# Patient Record
Sex: Male | Born: 1965 | Race: Black or African American | Hispanic: No | Marital: Single | Smoking: Never smoker
Health system: Southern US, Community
[De-identification: ages and names within clinical notes are randomized; demographics above are authoritative.]

## PROBLEM LIST (undated history)

## (undated) DIAGNOSIS — I10 Essential (primary) hypertension: Secondary | ICD-10-CM

## (undated) DIAGNOSIS — E119 Type 2 diabetes mellitus without complications: Secondary | ICD-10-CM

## (undated) HISTORY — PX: HERNIA REPAIR: SHX51

## (undated) HISTORY — PX: KNEE ARTHROSCOPY: SUR90

## (undated) HISTORY — PX: EYE SURGERY: SHX253

---

## 1997-07-31 ENCOUNTER — Ambulatory Visit (HOSPITAL_BASED_OUTPATIENT_CLINIC_OR_DEPARTMENT_OTHER): Admission: RE | Admit: 1997-07-31 | Discharge: 1997-07-31 | Payer: Self-pay | Admitting: *Deleted

## 2002-06-21 ENCOUNTER — Emergency Department (HOSPITAL_COMMUNITY): Admission: EM | Admit: 2002-06-21 | Discharge: 2002-06-21 | Payer: Self-pay | Admitting: Emergency Medicine

## 2003-07-07 ENCOUNTER — Emergency Department (HOSPITAL_COMMUNITY): Admission: EM | Admit: 2003-07-07 | Discharge: 2003-07-07 | Payer: Self-pay

## 2005-09-08 ENCOUNTER — Inpatient Hospital Stay (HOSPITAL_COMMUNITY): Admission: EM | Admit: 2005-09-08 | Discharge: 2005-09-10 | Payer: Self-pay | Admitting: Emergency Medicine

## 2005-09-08 ENCOUNTER — Ambulatory Visit: Payer: Self-pay | Admitting: Internal Medicine

## 2005-09-08 ENCOUNTER — Ambulatory Visit: Payer: Self-pay | Admitting: Cardiology

## 2005-09-12 ENCOUNTER — Ambulatory Visit: Payer: Self-pay | Admitting: Internal Medicine

## 2005-09-24 ENCOUNTER — Ambulatory Visit: Payer: Self-pay | Admitting: Internal Medicine

## 2005-10-13 ENCOUNTER — Ambulatory Visit: Payer: Self-pay | Admitting: Cardiology

## 2005-12-16 ENCOUNTER — Ambulatory Visit: Payer: Self-pay | Admitting: Internal Medicine

## 2006-04-09 DIAGNOSIS — I10 Essential (primary) hypertension: Secondary | ICD-10-CM | POA: Insufficient documentation

## 2006-11-24 ENCOUNTER — Telehealth: Payer: Self-pay | Admitting: *Deleted

## 2006-12-03 ENCOUNTER — Emergency Department (HOSPITAL_COMMUNITY): Admission: EM | Admit: 2006-12-03 | Discharge: 2006-12-03 | Payer: Self-pay | Admitting: Emergency Medicine

## 2007-01-12 ENCOUNTER — Encounter (INDEPENDENT_AMBULATORY_CARE_PROVIDER_SITE_OTHER): Payer: Self-pay | Admitting: *Deleted

## 2007-02-03 ENCOUNTER — Ambulatory Visit: Payer: Self-pay | Admitting: Internal Medicine

## 2007-02-03 DIAGNOSIS — Z947 Corneal transplant status: Secondary | ICD-10-CM | POA: Insufficient documentation

## 2007-02-03 DIAGNOSIS — J45909 Unspecified asthma, uncomplicated: Secondary | ICD-10-CM | POA: Insufficient documentation

## 2007-02-03 DIAGNOSIS — Z87442 Personal history of urinary calculi: Secondary | ICD-10-CM

## 2007-04-05 ENCOUNTER — Ambulatory Visit (HOSPITAL_BASED_OUTPATIENT_CLINIC_OR_DEPARTMENT_OTHER): Admission: RE | Admit: 2007-04-05 | Discharge: 2007-04-05 | Payer: Self-pay | Admitting: Orthopedic Surgery

## 2007-04-07 ENCOUNTER — Encounter (INDEPENDENT_AMBULATORY_CARE_PROVIDER_SITE_OTHER): Payer: Self-pay | Admitting: *Deleted

## 2007-07-03 IMAGING — CR DG CHEST 1V PORT
2 series · 2 of 2 positions shown · non-contrast
Comparison: 07/07/03.

CLINICAL DATA: Chest pain. 
 PORTABLE CHEST -  SINGLE VIEW - 09/08/05:

[view not recorded (1 of 2)]
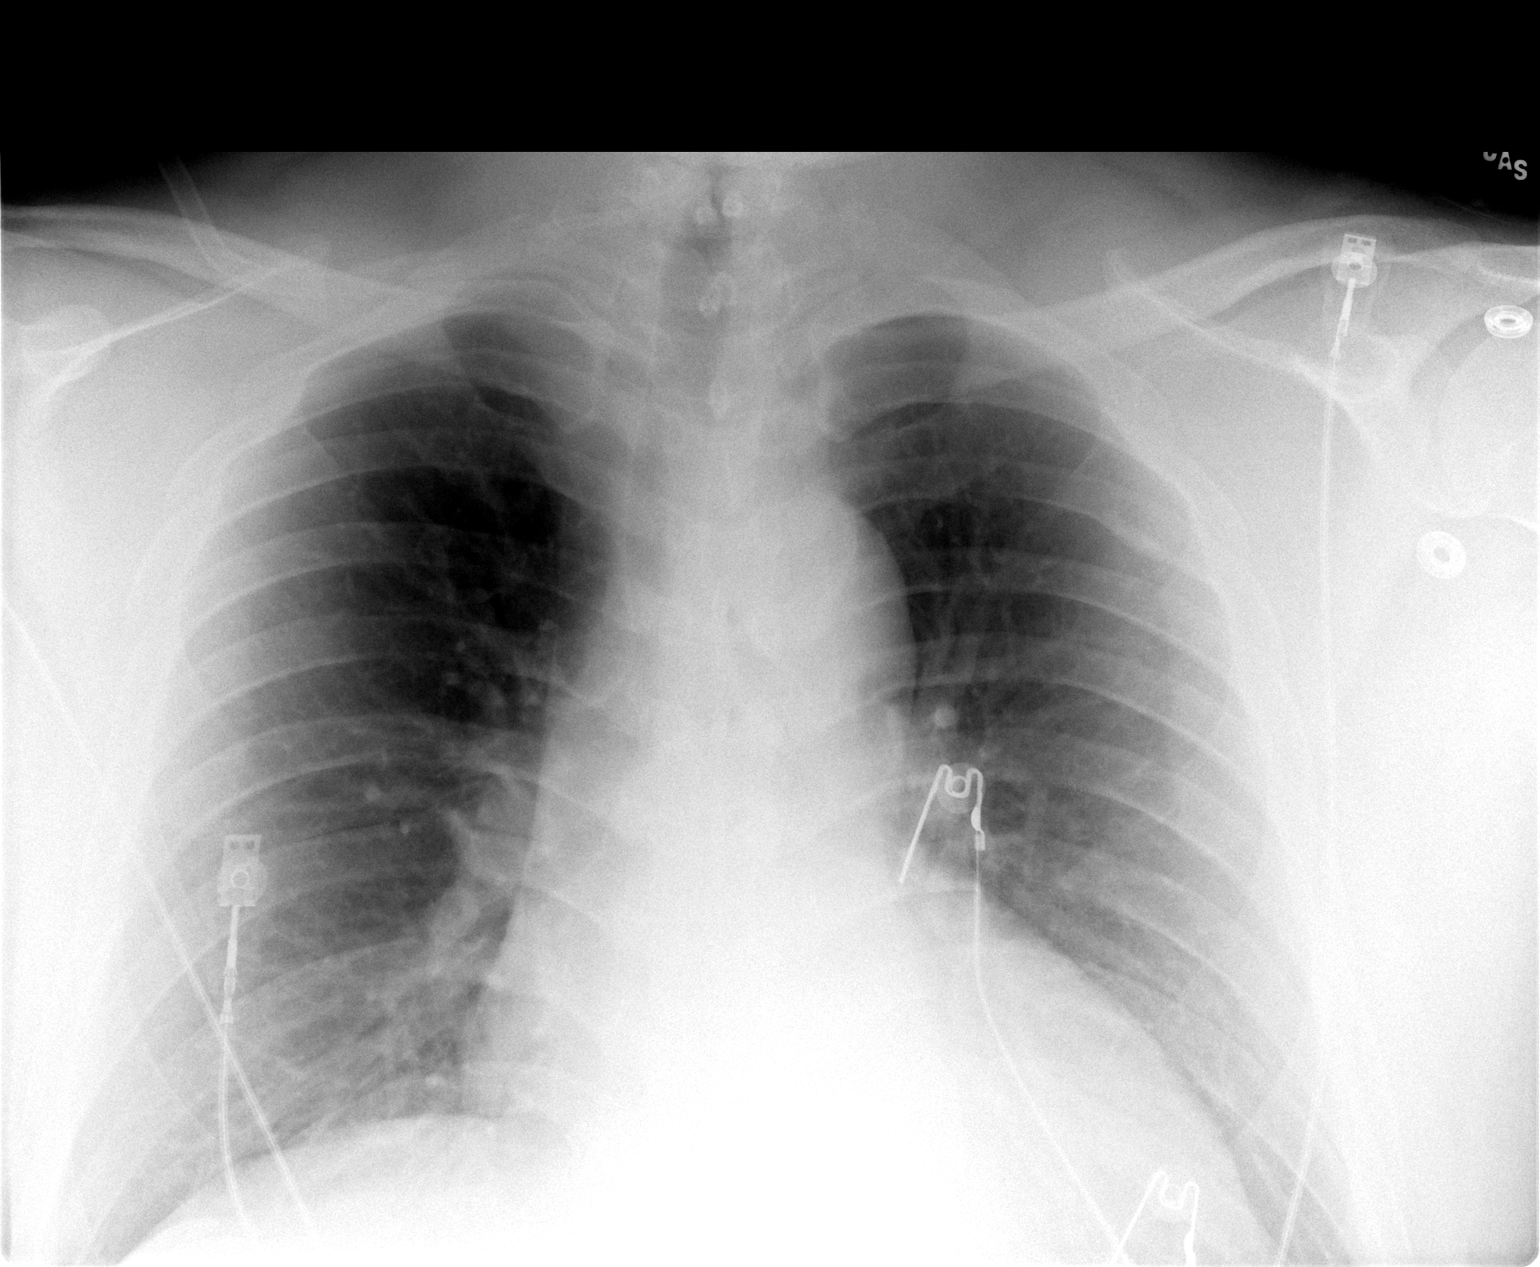

[view not recorded (2 of 2)]
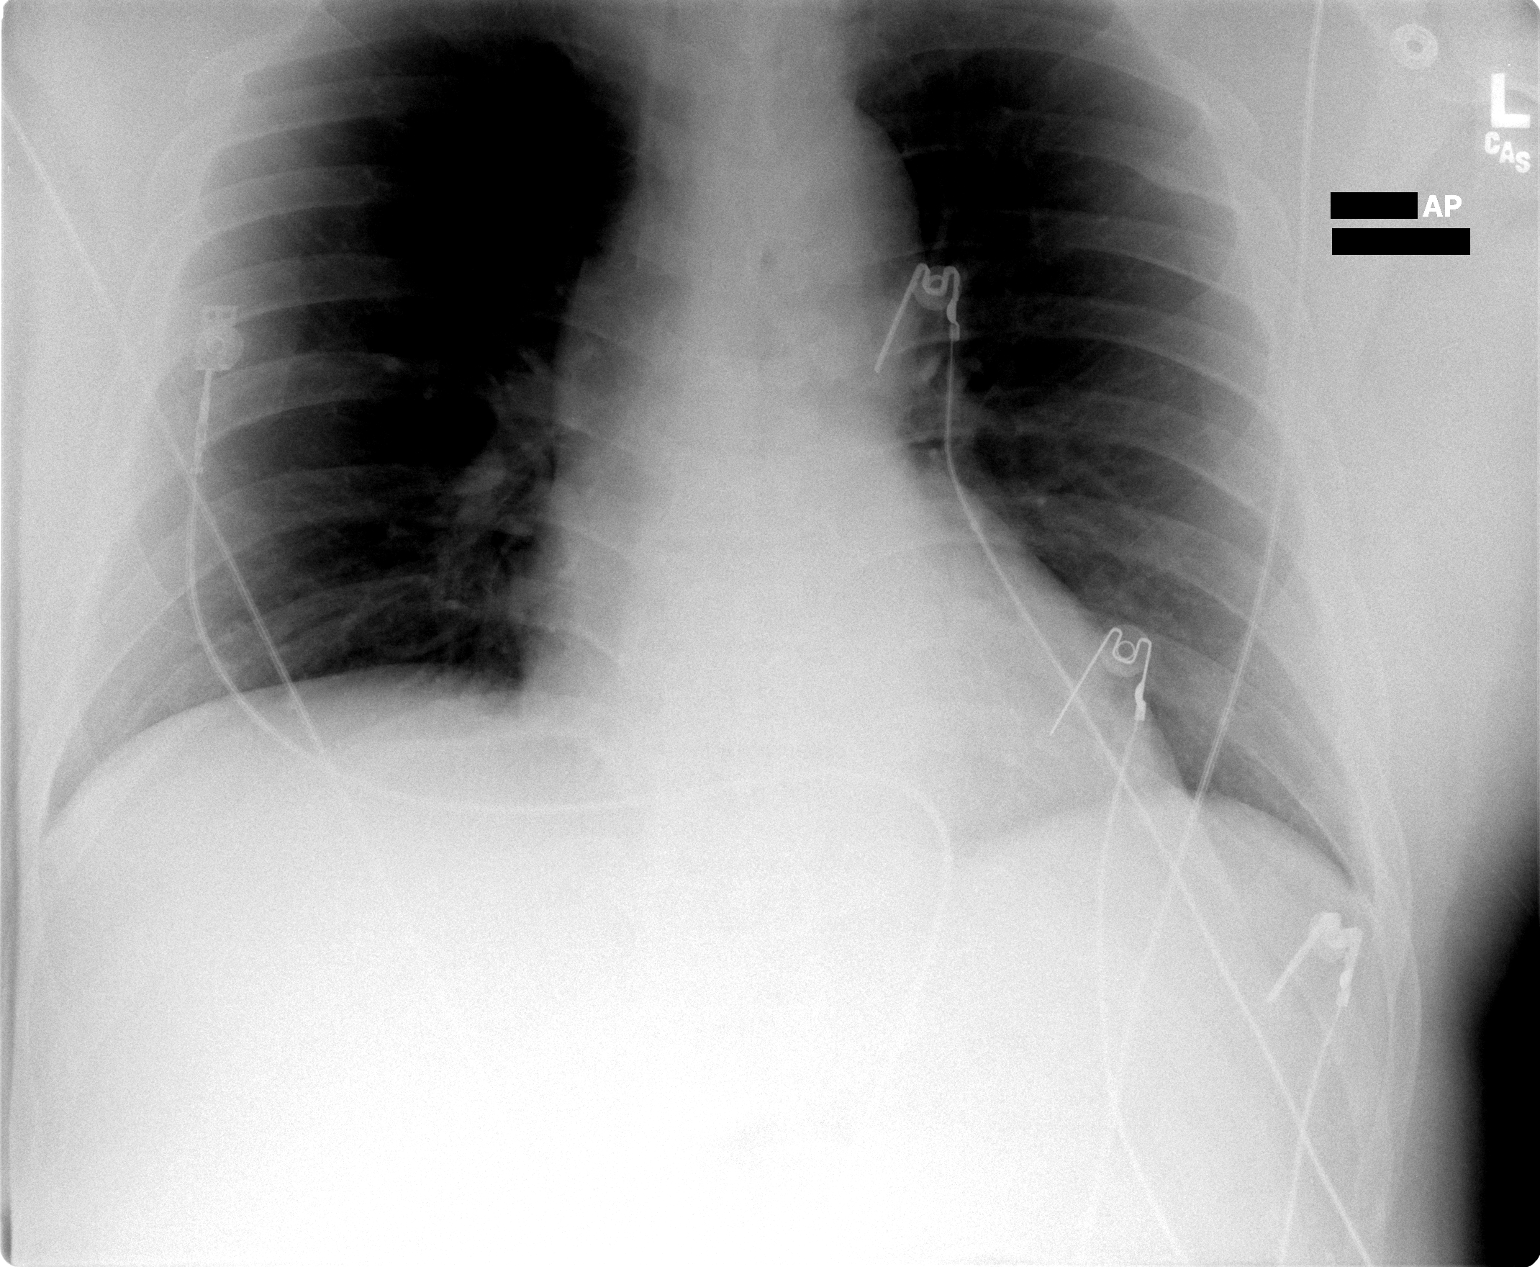

[2 of 2 positions shown; findings below may reference images not displayed]

FINDINGS: The heart size is within normal limits.  The lungs are clear.
IMPRESSION: As discussed above.

## 2010-09-03 NOTE — Op Note (Signed)
Jared Miller, Jared Miller                ACCOUNT NO.:  [id number]   MEDICAL RECORD NO.:  [id number]          PATIENT TYPE:  AMB   LOCATION:  DSC                          FACILITY:  [facility]   PHYSICIAN:  Harvie Junior, M.D.   DATE OF BIRTH:  February 22, 1966   DATE OF PROCEDURE:  04/05/2007  DATE OF DISCHARGE:  04/05/2007                               OPERATIVE REPORT   PREOPERATIVE DIAGNOSIS:  Medial meniscal tear with questionable anterior  cruciate ligament tear.   POSTOPERATIVE DIAGNOSES:  1. Posterior horn medial meniscal tear.  2. Chondromalacia, medial patellar facet.  3. Large medial shelf plica.  4. Intact anterior cruciate ligament.   PRINCIPLE PROCEDURE:  1. Partial posterior horn medial meniscectomy.  2. Debridement of a medial patellar facet.  3. Debridement of medial shelf plica.   SURGEON:  Harvie Junior, M.D.   ASSISTANT:  Marshia Ly, P.A.   ANESTHESIA:  General.   BRIEF HISTORY:  [name] is a 45 year old male with a long history of  having had a twisting-type injury at work.  He was evaluated in the  office and felt to have a medial meniscal tear.  MRI had been obtained  prior to our seeing him, which showed that he had a posterior horn  medial meniscal tear.  There was a question of an anterior cruciate  ligament tear, remote, although his examination felt to be stable.  Ultimately, because of this large meniscal tear, he was taken to the  operating room for operative knee arthroscopy.   PROCEDURE:  Patient was taken to the operating room.  After adequate  anesthesia was obtained under general anesthetic, the patient was placed  on the operating room table.  The right leg was prepped and draped in  the usual sterile fashion.  Following this, routine arthroscopic  examination of the knee revealed there was an obvious posterior horn  medial meniscal tear.  This was debrided back to a smooth and stable rim  with a fairly large tear and somewhat difficult to get to  in the back,  but we were able to debride it.  I probably took out between 15-20% of  the meniscal and total in the posterior area.  With complete attention  turned to the notch where ACL was evaluated and probed and felt to be  stable.  We had good resistance to anterior translation, really appeared  to be normal.  The lateral side was normal.  In the medial gutter, there  was a significant medial shelf plica which was debrided back to the rim,  and he also had some grade II and grade III change of the medial  patellar facet, which was debrided.  A little bit of lateral tracking,  but certainly there was plenty of rim up under the patella.  I did not  feel a lateral retinacular release was appropriate.  At this point, the  knee was copiously and thoroughly irrigated and suctioned dry.  The  arthroscopic  portals were closed.  The bandage, sterile compressive dressing was  applied.  Patient was taken to the recovery room.  Was noted to be in  satisfactory condition.   Estimated blood loss for the procedure was __________ .      Harvie Junior, M.D.  Electronically Signed     JLG/MEDQ  D:  04/05/2007  T:  04/05/2007  Job:  [id number]

## 2010-09-06 NOTE — Consult Note (Signed)
Jared Miller, NEUSER                ACCOUNT NO.:  [id number]   MEDICAL RECORD NO.:  [id number]          PATIENT TYPE:  INP   LOCATION:  1830                         FACILITY:  [facility]   PHYSICIAN:  Thomas C. Wall, M.D.   DATE OF BIRTH:  1966-03-18   DATE OF CONSULTATION:  09/08/2005  DATE OF DISCHARGE:                                   CONSULTATION   CARDIOLOGY CONSULTATION   PRIMARY CARE PHYSICIANS:  Primary care physician:  Patient does not have  one.  Admitting physician:  Dr. Ileana Roup.  Cardiologist:  Patient is  new to [facility] and is being seen by Dr. Jesse Sans. Wall.   PATIENT PROFILE:  The patient is a 45 year old African-American male with no  prior history of coronary artery disease who presents with hypertensive  urgency and chest tightness.   PROBLEM LIST:  1.  Hypertensive urgency.  2.  Status post left corneal transplant.  3.  Status post umbilical hernia repair in 1998.  4.  Nephrolithiasis with stent in 1996.  5.  Asthma.  6.  Obesity.   HISTORY OF PRESENT ILLNESS:  45 year old African-American male with no prior  history of coronary artery disease.  He has not been seen by a physician in  approximately two years and has no primary care physician.  He was in his  usual state of health until this morning when he was driving to work and  developed mild nausea.  Once at work he was sitting and developed 8 out of  10 substernal chest tightness and squeezing moving into his throat  associated with significant shortness of breath.  He saw the R.N. on site at  his work place and EMS was called.  On their arrival his blood pressure was  224/148.  He was treated with three sublingual nitroglycerin and 160 mg of  aspirin with reduction of chest tightness to 2 out of 10 and complete  improvement in his shortness of breath.  He was taken to the [facility]  emergency department where electrocardiogram was within normal limits and  cardiac markers are  negative x1.   ALLERGIES:  PENICILLIN.   HOME MEDICATIONS:  Pred Forte one drop right eye daily.   FAMILY HISTORY:  Mother is age 68 with hypertension.  Father died at 40 of  colon cancer.  He also had diabetes and suffered an myocardial infarction in  his life.  Siblings: He has one brother age 53 and has hypertension.   SOCIAL HISTORY:  He lives in [place] with his fiancee'.  He works for  [employer] on [employer].  He has two boys. He  has never smoked tobacco.  He drinks maybe two beers per month.  He has  never used any drugs.  He does not routinely exercise.   REVIEW OF SYSTEMS:  Positive for headache after nitroglycerin today.  Positive for chest pain and shortness of breath as described in the history  of present illness.  Positive for mild nausea.  All other systems reviewed  and negative.   PHYSICAL EXAMINATION:  VITAL SIGNS:  Temperature 98.6, heart rate 72,  respirations 18, blood pressure 170/102. Pulse oximetry 98% on 2L.  GENERAL APPEARANCE:  Pleasant African-American male in no acute distress. He  is awake, alert and oriented x3.  NECK:  Obese, difficult to assess JVP.  No bruits.  LUNGS:  Respirations regular and unlabored.  Clear to auscultation .  CARDIAC:  Regular S1, S2, no S3, S4 or murmurs.  ABDOMEN:  Round, soft, nontender, nondistended.  Bowel sounds present x4.  EXTREMITIES:  Warm and dry.  No cyanosis, clubbing or edema.  Dorsalis pedis  pulses and posterior tibial pulses are 2+ and equal bilaterally.   CLINICAL DATA:  Electrocardiogram shows sinus rhythm, normal axis, rate 79.  No ST-T changes.   LABORATORY DATA:  Sodium 137, potassium 3.9, chloride 105, cO2 23, BUN 11,  creatinine 1.1, glucose 96, total bilirubin 0.7, alkaline phosphatase 67,  AST 23, ALT 37, total protein 7.2, albumin 4.2, calcium 9.6.   ASSESSMENT/PLAN:  1.  Hypertensive urgency.  Patient presented with chest tightness, shortness      of breath in  the setting of markedly elevated blood pressure.  His blood      pressure is down some, into the 170's, with more-or-less resolution of      the chest pressure and no shortness of breath at this time.  His cardiac      markers are negative x1 with an electrocardiogram that shows no acute      changes.  We will continue to cycle his cardiac markers.  Will also      check a 2-dimensional echocardiogram as an inpatient.  If both enzymes      and echocardiogram are within normal limits could plan to discharge with      followup outpatient functional study, once blood pressure is under      control.  Will add hydrochlorothiazide and amlodipine for starters.  We      have stressed the importance of a low sodium diet as well as weight      loss.  We will check lipids.  2.  Chest pain.  See above.  Cycles enzymes.  3.  Asthma.  No inhalers are home. He is not currently wheezing.  4.  Obesity.  Recommended weight loss.      Ok Anis, NP      Jesse Sans. Wall, M.D.  Electronically Signed    CRB/MEDQ  D:  09/08/2005  T:  09/09/2005  Job:  [id number]

## 2010-09-06 NOTE — Assessment & Plan Note (Signed)
[facility]                          [place] OFFICE NOTE   NAME:Jared Miller, Jared Miller                       MRN:          [id number]  DATE:12/16/2005                            DOB:          1965-06-21    CHIEF COMPLAINT:  Here to establish.   HISTORY OF PRESENT ILLNESS:  Mr. Jared Miller is a 45 year old gentleman who came to  the office to get established.  Three months ago he felt poorly.  He had  headache, chest pain, and shortness of breath.  He was rushed to the  hospital, where his blood pressure was found to be extremely high for the  first time ever.  He was stabilized.  At the present time, he is doing  great.  During that admission, an echo was done and it was negative.  Again  at the present time, he is basically asymptomatic.  He is ambulatory.  Readings are around 120/80.  He needs refills today.   PAST MEDICAL HISTORY:  1. Hypertension, diagnosed 3 months ago.  2. History of asthma.  3. Umbilical hernia repair in 1999.  4. History of a kidney stone with a stent in 1997.  5. 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  6. Strong family history of heart disease, also prostate and colon cancer.   FAMILY HISTORY:  Father according with the patient suffered from prostate  cancer, colon cancer, diabetes, and have an early in life MI.  Mother has  hypertension.   SOCIAL HISTORY:  Does not smoke.  Drinks rarely.  He is single and has 2  kids.   REVIEW OF SYSTEMS:  Again, he is doing well.  Denies any current headache,  chest pain, shortness of breath.   MEDICATIONS:  1. HCTZ 25, one p.o. every day.  2. Amlodipine 10, one p.o. every day.  3. Toprol XL 100, one p.o. every day.  4. Lisinopril 40, one p.o. every day.   ALLERGIES:  PENICILLIN CAUSED HIVES AND AN ASTHMA ATTACK.   PHYSICAL EXAMINATION:  GENERAL:  The patient is alert, oriented, in no  apparent distress.  VITAL SIGNS:  He is 6  feet 3 inches tall.  He weighs 201 pounds.  Blood  pressure today is 150/100 in the office.  Pulse 80.  NECK:  No thyromegaly.  LUNGS:  Clear to auscultation bilaterally.  CARDIOVASCULAR:  Regular rate and rhythm without a murmur.  ABDOMEN:  Not distended.  Soft.  Good bowel sounds.  No organomegaly.  I  hear no bruits.  EXTREMITIES:  No edema.   LABORATORY AND X-RAYS:  Records from the hospital are reviewed.  On May  2007, his baseline creatinine without any medication was 1.1 and a potassium  of 3.9.  LFTs were normal.  TSH was normal.  Total cholesterol was 166, LDL  87, HDL 31.  A1c was 5.7.  He did have a positive microalbuminuria test.   ASSESSMENT/PLAN:  1. The patient is here for refill of his medication.  I did a 30-day  supply with 12 refills on all his medications.  2. He has good ambulatory blood pressure readings.  3. We will recheck a BMP and an albumin.  4. His A1c is slightly increased and will have to be rechecked at some      point this year.  5. He is encouraged to have a healthy lifestyle.  Information about low      salt diet and exercise was given to the patient.  6. I also talked to him.  I gave him information about hypertension.  I      discussed with him the long term risk of high blood pressure and the      fact that he will need medical checkups and taking medication for life      to get the blood pressure well controlled.  7. Office visit in 3 months.                                   Willow Ora, MD   JP/MedQ  DD:  12/16/2005  DT:  12/16/2005  Job #:  [id number]

## 2010-09-06 NOTE — Discharge Summary (Signed)
Jared Miller, UPTAIN                ACCOUNT NO.:  [id number]   MEDICAL RECORD NO.:  [id number]          PATIENT TYPE:  INP   LOCATION:  4710                         FACILITY:  [facility]   PHYSICIAN:  Ileana Roup, M.D.  DATE OF BIRTH:  1965/06/28   DATE OF ADMISSION:  09/08/2005  DATE OF DISCHARGE:  09/10/2005                                 DISCHARGE SUMMARY   DISCHARGE DIAGNOSES:  1.  Hypertensive urgency, new diagnosis of hypertension.  2.  Acute coronary syndrome.  3.  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  4.  History of asthma.  5.  History of umbilical hernia repair in 1998.  6.  History of nephrolithiasis with stent placement in 1996.   DISCHARGE MEDICATIONS:  1.  Lisinopril 40 mg 1 tablet daily.  2.  Hydrochlorothiazide 25 mg 1 tablet daily.  3.  Amlodipine 10 mg 1 tablet daily.  4.  Toprol-XL 75 mg total, 3 of 25 mg tablets to be taken daily.  5.  Aspirin 81 mg daily.  6.  Pred Forte 2% solution 1 drop to the right eye daily.   HOSPITAL FOLLOWUP APPOINTMENTS:  1.  Outpatient clinic for blood pressure check and follow up basic metabolic      panel on Sep 12, 2005, at 2 p.m.  2.  Dr. Valera Castle, [facility] on June 25 at 1:45 p.m.  also, the      patient will contact [facility] regarding his      establishment of primary care with their institution.   DISPOSITION:  Stable.   CONSULTATIONS:  Cardiology.   PROCEDURE:  Two-D echocardiogram demonstrating normal ejection fraction  except for increased left ventricular wall thickness in a pattern of  moderate concentric hypertrophy.   HISTORY AND PHYSICAL:  Jared Miller is a 45 year old African-American man, with  no known history of hypertension, who presented to the emergency room with  complaints of significant shortness of breath and chest pain which occurred  at rest while at work.  Apparently, the patient was sitting at his desk when  suddenly he developed  severe substernal chest pain at 8 in the morning,  described as heightening on an 8/10 scale which was nonradiating.  He tried  to walk the chest pain off around the office to ease the discomfort, but had  no significant relief.  The patient also tried over-the-counter Rolaids, but  because of the persistent nature, saw the office nurse who gave him some  oxygen and then called EMS.  He received 3 sublingual nitroglycerins, as  well as 1 aspirin and experienced a slight relief of his pain.  He denied  any associated diaphoresis, palpitations, nausea, vomiting, swelling of the  limbs, cough, or fevers or chills.  The patient did complain of a history of  chest pain and asthmatic shortness of breath whenever he mows his lawn, but  he states this has been ongoing.  In the emergency room, the patient  received 4 mg of IV morphine.  He was placed on a nitroglycerin drip, as  well  as 5 mg of IV Lopressor and Tylenol dosing.   HOME MEDICATIONS:  Pred Forte 1 drop 2% to the right eye daily.   SOCIAL HISTORY:  The patient is currently married.  He works at the [employer].   SUBSTANCE HISTORY:  The patient denies any tobacco use, cocaine, or IV  drugs.   PHYSICAL EXAMINATION:  VITAL SIGNS:  Temperature 98.6, blood pressure on  admission 184/122, pulse 110, respirations 22, O2 saturation 95% on room  air.  GENERAL:  The patient, at the time, was receiving IV nitroglycerin and  appeared in no significant distress, although complaining of a mild  headache.  HEENT:  Demonstrated corneal scarring to the right eye with no evidence of  papilledema by funduscopic exam.  NECK:  Supple with no adenopathy or thyromegaly.  LUNGS:  Clear to auscultation bilaterally with no wheezes, rales, or  rhonchi.  CARDIOVASCULAR:  Showed a tachycardic rhythm, but no appreciable murmurs,  rubs, or gallops, and the chest pain was nonreproducible with percussion.  The rest of the exam was essentially  unremarkable.   ADMISSION LAB VALUES:  D-dimer was less than 0.22, point of care markers  were unremarkable for cardiac enzymes.  I-STAT;  Sodium 137, potassium 4,  chloride 107, bicarbonate 23, BUN 10, creatinine 1.2, glucose 109.  CBC:  Hemoglobin 14.5, hematocrit 43.3, white blood cell count 9.2, platelets 282.   HOSPITAL COURSE:  1.  Hypertensive urgency.  The patient, as stated above, was on a      nitroglycerin drip at the time of evaluation.  Subsequently, this was      titrated down as the patient's blood pressure improved.  He was      subsequently started on hydrochlorothiazide and amlodipine with 2-D      echocardiogram ordered for evaluation.  TSH was essentially      unremarkable.  Followup cardiac enzymes were also unremarkable.      Cardiology was consulted shortly after presentation because of his      significant chest pain and profound hypertensive disease.  They agreed      with the management and recommended a fasting lipid panel as well as the      follow up for 2-D echocardiogram.  After essentially ruling out for an      acute MI, a 2-D echocardiogram revealed moderate concentric hypertrophy,      although over the course of the next few days, the blood pressure raised      somewhat such that the patient required addition of metoprolol 25 mg      t.i.d., increasing his amlodipine to 10 mg daily, and adding lisinopril      20 mg.  By the day of discharge, cardiology recommended increasing his      lisinopril to 40 mg daily, hydrochlorothiazide to 25 mg daily, and a      decision was made to change the patient over to Toprol-XL 75 mg total.      The patient was advised that a followup appointment would be made to      monitor his basic metabolic panel for Friday, Sep 12, 2005.  Also      cardiology appointment was made for June 25 to see Dr. Daleen Squibb.  By the day     of discharge, the patient's blood pressure lying was 132/84 with pulse      of 87, and orthostatic vital  signs were obtained which demonstrated no  acute orthostatic hypertension by the day of discharge.   1.  Acute coronary syndrome.  The patient, as above, ruled out for acute      myocardial infarction with negative enzymes.  EKG changes were also      unremarkable during this admission, all demonstrating a normal sinus      rhythm.  Based on the echocardiogram with no wall motion abnormalities,      it was felt that the patient did not require any further invasive      testing.   1.  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      The patient presented on Pred Forte ophthalmologic drops and these were      continued during his hospitalization.  No problems were noted with this      diagnosis.   DISCHARGE LABORATORY STUDIES:  Hemoglobin 14.8, hematocrit 43.8, platelets  281.  Sodium 136, potassium 3.9, BUN 10, creatinine 1.2, glucose 107.  Hemoglobin A1c 5.7.  Micro albumin to creatinine ratio was 126.7 mg/g which  is significantly elevated.  Protein to creatinine urine ratio was slightly  elevated at 0.19.  TSH was 0.495.  Fasting lipid panel:  Cholesterol 166,  triglycerides 239, HDL 31, LDL 87.      Coralie Carpen, M.D.    ______________________________  Ileana Roup, M.D.    FR/MEDQ  D:  09/10/2005  T:  09/10/2005  Job:  [id number]   cc:   Thomas C. Wall, M.D.  [address]  [address]  [place]  [place] [address]

## 2011-01-24 LAB — I-STAT 8, (EC8 V) (CONVERTED LAB)
Bicarbonate: 24.3 — ABNORMAL HIGH
Chloride: 105
Potassium: 4.3
Sodium: 137
pH, Ven: 7.389 — ABNORMAL HIGH

## 2014-01-29 ENCOUNTER — Emergency Department (HOSPITAL_COMMUNITY)
Admission: EM | Admit: 2014-01-29 | Discharge: 2014-01-29 | Disposition: A | Discharge status: Home / Self Care | Payer: Self-pay | Attending: Emergency Medicine | Admitting: Emergency Medicine

## 2014-01-29 ENCOUNTER — Emergency Department (HOSPITAL_COMMUNITY): Payer: Self-pay

## 2014-01-29 ENCOUNTER — Encounter (HOSPITAL_COMMUNITY): Payer: Self-pay | Admitting: Emergency Medicine

## 2014-01-29 DIAGNOSIS — Z79899 Other long term (current) drug therapy: Secondary | ICD-10-CM | POA: Insufficient documentation

## 2014-01-29 DIAGNOSIS — Z791 Long term (current) use of non-steroidal anti-inflammatories (NSAID): Secondary | ICD-10-CM | POA: Insufficient documentation

## 2014-01-29 DIAGNOSIS — S161XXA Strain of muscle, fascia and tendon at neck level, initial encounter: Secondary | ICD-10-CM | POA: Insufficient documentation

## 2014-01-29 DIAGNOSIS — I10 Essential (primary) hypertension: Secondary | ICD-10-CM | POA: Insufficient documentation

## 2014-01-29 DIAGNOSIS — S39012A Strain of muscle, fascia and tendon of lower back, initial encounter: Secondary | ICD-10-CM | POA: Insufficient documentation

## 2014-01-29 DIAGNOSIS — Z880 Allergy status to penicillin: Secondary | ICD-10-CM | POA: Insufficient documentation

## 2014-01-29 DIAGNOSIS — Y92410 Unspecified street and highway as the place of occurrence of the external cause: Secondary | ICD-10-CM | POA: Insufficient documentation

## 2014-01-29 DIAGNOSIS — Y9389 Activity, other specified: Secondary | ICD-10-CM | POA: Insufficient documentation

## 2014-01-29 HISTORY — DX: Essential (primary) hypertension: I10

## 2014-01-29 MED ORDER — HYDROCODONE-ACETAMINOPHEN 5-325 MG PO TABS: 1.0000 | ORAL_TABLET | ORAL | Status: AC | PRN

## 2014-01-29 MED ORDER — DIAZEPAM 5 MG PO TABS: 5.0000 mg | ORAL_TABLET | Freq: Two times a day (BID) | ORAL | Status: AC

## 2014-01-29 MED ORDER — HYDROCHLOROTHIAZIDE 25 MG PO TABS
25.0000 mg | ORAL_TABLET | Freq: Once | ORAL | Status: AC
  Administered 2014-01-29: 25 mg via ORAL
  Filled 2014-01-29: qty 1

## 2014-01-29 MED ORDER — HYDROCHLOROTHIAZIDE 25 MG PO TABS: 25.0000 mg | ORAL_TABLET | Freq: Every day | ORAL | Status: AC

## 2014-01-29 MED ORDER — IBUPROFEN 800 MG PO TABS
800.0000 mg | ORAL_TABLET | Freq: Three times a day (TID) | ORAL | Status: AC
Start: 2014-01-29 — End: ?

## 2014-01-29 MED ORDER — OXYCODONE-ACETAMINOPHEN 5-325 MG PO TABS
2.0000 | ORAL_TABLET | Freq: Once | ORAL | Status: AC
  Administered 2014-01-29: 2 via ORAL
  Filled 2014-01-29: qty 2
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
## 2014-01-29 NOTE — ED Provider Notes (Signed)
Medical screening examination/treatment/procedure(s) were performed by non-physician practitioner and as supervising physician I was immediately available for consultation/collaboration.   EKG Interpretation None        Sherlock Nancarrow M Josuha Fontanez, MD 01/29/14 1543 
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
## 2014-05-31 ENCOUNTER — Encounter (HOSPITAL_BASED_OUTPATIENT_CLINIC_OR_DEPARTMENT_OTHER): Payer: Self-pay | Admitting: *Deleted

## 2014-05-31 ENCOUNTER — Emergency Department (HOSPITAL_BASED_OUTPATIENT_CLINIC_OR_DEPARTMENT_OTHER)
Admission: EM | Admit: 2014-05-31 | Discharge: 2014-05-31 | Disposition: A | Discharge status: Home / Self Care | Payer: 59 | Attending: Emergency Medicine | Admitting: Emergency Medicine

## 2014-05-31 DIAGNOSIS — Z79899 Other long term (current) drug therapy: Secondary | ICD-10-CM | POA: Insufficient documentation

## 2014-05-31 DIAGNOSIS — E119 Type 2 diabetes mellitus without complications: Secondary | ICD-10-CM | POA: Diagnosis not present

## 2014-05-31 DIAGNOSIS — I10 Essential (primary) hypertension: Secondary | ICD-10-CM | POA: Diagnosis present

## 2014-05-31 DIAGNOSIS — Z880 Allergy status to penicillin: Secondary | ICD-10-CM | POA: Insufficient documentation

## 2014-05-31 DIAGNOSIS — E669 Obesity, unspecified: Secondary | ICD-10-CM | POA: Diagnosis not present

## 2014-05-31 DIAGNOSIS — Z791 Long term (current) use of non-steroidal anti-inflammatories (NSAID): Secondary | ICD-10-CM | POA: Insufficient documentation

## 2014-05-31 HISTORY — DX: Type 2 diabetes mellitus without complications: E11.9

## 2014-05-31 MED ORDER — LISINOPRIL 10 MG PO TABS
20.0000 mg | ORAL_TABLET | Freq: Once | ORAL | Status: AC
  Administered 2014-05-31: 20 mg via ORAL
  Filled 2014-05-31: qty 2

## 2014-05-31 MED ORDER — HYDROCHLOROTHIAZIDE 25 MG PO TABS
25.0000 mg | ORAL_TABLET | Freq: Once | ORAL | Status: AC
  Administered 2014-05-31: 25 mg via ORAL
  Filled 2014-05-31: qty 1

## 2014-05-31 MED ORDER — LISINOPRIL 10 MG PO TABS
ORAL_TABLET | ORAL | Status: AC
  Filled 2014-05-31: qty 1

## 2014-05-31 MED ORDER — METOPROLOL TARTRATE 50 MG PO TABS
50.0000 mg | ORAL_TABLET | Freq: Once | ORAL | Status: AC
  Administered 2014-05-31: 50 mg via ORAL
  Filled 2014-05-31: qty 1

## 2014-05-31 MED ORDER — CLONIDINE HCL 0.1 MG PO TABS
0.1000 mg | ORAL_TABLET | Freq: Once | ORAL | Status: AC
  Administered 2014-05-31: 0.1 mg via ORAL
  Filled 2014-05-31: qty 1

## 2014-05-31 NOTE — ED Provider Notes (Signed)
CSN: [id number]     Arrival date & time 05/31/14  [id number] History   First MD Initiated Contact with Patient 05/31/14 1112     Chief Complaint  Patient presents with  . Hypertension     (Consider location/radiation/quality/duration/timing/severity/associated sxs/prior Treatment) HPI Patient reports that he went for a physical today and to get restarted on his blood pressure medications. He reports his physician evaluated him and gave him prescriptions to start but sent him to the emergency department to get his high blood pressure treated today. The patient denies he's had any symptoms in association with his high blood pressure. He reports that he was off medications for year due to lack of insurance. He denies he's had any problems with headaches, blurred vision, chest pain, dyspnea. The patient reports he works a night shift and stands a lot so sometimes he gets swelling in his ankles but not a large amount and no pain in his calves.  Past Medical History  Diagnosis Date  . Hypertension   . Diabetes mellitus without complication    Past Surgical History  Procedure Laterality Date  . Eye surgery    . Knee arthroscopy    . Hernia repair     No family history on file. History  Substance Use Topics  . Smoking status: Never Smoker   . Smokeless tobacco: Not on file  . Alcohol Use: No    Review of Systems 10 Systems reviewed and are negative for acute change except as noted in the HPI.   Allergies  Penicillins  Home Medications   Prior to Admission medications   Medication Sig Start Date End Date Taking? Authorizing Provider  gabapentin (NEURONTIN) 300 MG capsule Take 300 mg by mouth 3 (three) times daily.   Yes Historical Provider, MD  glipiZIDE (GLUCOTROL XL) 5 MG 24 hr tablet Take 5 mg by mouth daily with breakfast.   Yes Historical Provider, MD  lisinopril-hydrochlorothiazide (PRINZIDE,ZESTORETIC) 20-25 MG per tablet Take 1 tablet by mouth 2 (two) times daily.   Yes  Historical Provider, MD  metFORMIN (GLUCOPHAGE) 500 MG tablet Take by mouth 2 (two) times daily with a meal.   Yes Historical Provider, MD  metoprolol succinate (TOPROL-XL) 50 MG 24 hr tablet Take 50 mg by mouth 2 (two) times daily. Take with or immediately following a meal.   Yes Historical Provider, MD  acetaminophen (TYLENOL) 500 MG tablet Take 1,000 mg by mouth every 6 (six) hours as needed for mild pain.    Historical Provider, MD  diazepam (VALIUM) 5 MG tablet Take 1 tablet (5 mg total) by mouth 2 (two) times daily. 01/29/14   Robyn M Hess, PA-C  GINKGO BILOBA PO Take 1 capsule by mouth 2 (two) times daily.    Historical Provider, MD  hydrochlorothiazide (HYDRODIURIL) 25 MG tablet Take 1 tablet (25 mg total) by mouth daily. 01/29/14   Kathrynn Speedobyn M Hess, PA-C  HYDROcodone-acetaminophen (NORCO/VICODIN) 5-325 MG per tablet Take 1-2 tablets by mouth every 4 (four) hours as needed. 01/29/14   Robyn M Hess, PA-C  ibuprofen (ADVIL,MOTRIN) 800 MG tablet Take 1 tablet (800 mg total) by mouth 3 (three) times daily. 01/29/14   Robyn M Hess, PA-C   BP 152/96 mmHg  Pulse 78  Temp(Src) 98.2 F (36.8 C) (Oral)  Resp 18  Ht 6\' 5"  (1.956 m)  Wt 258 lb (117.028 kg)  BMI 30.59 kg/m2  SpO2 98% Physical Exam  Constitutional: He is oriented to person, place, and time. He appears well-developed and  well-nourished.  A she is obese but well in appearance without distress.  HENT:  Head: Normocephalic and atraumatic.  Eyes: EOM are normal. Pupils are equal, round, and reactive to light.  Neck: Neck supple.  Cardiovascular: Normal rate, regular rhythm, normal heart sounds and intact distal pulses.   Pulmonary/Chest: Effort normal and breath sounds normal.  Abdominal: Soft. Bowel sounds are normal. He exhibits no distension. There is no tenderness.  Musculoskeletal: Normal range of motion. He exhibits no edema.  Neurological: He is alert and oriented to person, place, and time. He has normal strength. Coordination  normal. GCS eye subscore is 4. GCS verbal subscore is 5. GCS motor subscore is 6.  Skin: Skin is warm, dry and intact.  Psychiatric: He has a normal mood and affect.    ED Course  Procedures (including critical care time) Labs Review Labs Reviewed - No data to display  Imaging Review No results found.   EKG Interpretation None      MDM   Final diagnoses:  Essential hypertension   The patient presented without any symptoms of end organ damage. He has uncontrolled hypertension due to not being treated. The patient was given the medications that were prescribed by his family physician and a 0.1 mg dose of clonidine. He has responded well to this treatment and can now continue his medications as prescribed. He is counseled on the need to return if any signs of headache blurred vision chest pain or other concerning symptomology.    Arby Barrette, MD 05/31/14 [phone]

## 2014-05-31 NOTE — Discharge Instructions (Signed)

## 2014-05-31 NOTE — ED Notes (Signed)
States he went off BP meds 1 year ago d/t lack of insurance. Pt went for physical today and BP was elevated and was sent here.
